# Patient Record
Sex: Female | Born: 1935 | Race: White | Hispanic: No | Marital: Married
Health system: Southern US, Community
[De-identification: ages and names within clinical notes are randomized; demographics above are authoritative.]

---

## 1998-04-30 ENCOUNTER — Ambulatory Visit (HOSPITAL_COMMUNITY): Admission: RE | Admit: 1998-04-30 | Discharge: 1998-04-30 | Payer: Self-pay | Admitting: Neurosurgery

## 1998-08-24 ENCOUNTER — Encounter: Payer: Self-pay | Admitting: Neurosurgery

## 1998-08-24 ENCOUNTER — Ambulatory Visit (HOSPITAL_COMMUNITY): Admission: RE | Admit: 1998-08-24 | Discharge: 1998-08-24 | Payer: Self-pay | Admitting: Neurosurgery

## 1998-09-05 ENCOUNTER — Emergency Department (HOSPITAL_COMMUNITY): Admission: EM | Admit: 1998-09-05 | Discharge: 1998-09-05 | Payer: Self-pay | Admitting: Emergency Medicine

## 1999-12-03 ENCOUNTER — Encounter: Payer: Self-pay | Admitting: Internal Medicine

## 1999-12-03 ENCOUNTER — Encounter: Admission: RE | Admit: 1999-12-03 | Discharge: 1999-12-03 | Payer: Self-pay | Admitting: Internal Medicine

## 2001-11-09 ENCOUNTER — Encounter: Admission: RE | Admit: 2001-11-09 | Discharge: 2001-11-09 | Payer: Self-pay

## 2002-01-30 ENCOUNTER — Encounter: Admission: RE | Admit: 2002-01-30 | Discharge: 2002-01-30 | Payer: Self-pay | Admitting: Cardiology

## 2002-01-30 ENCOUNTER — Encounter: Payer: Self-pay | Admitting: Cardiology

## 2004-06-02 ENCOUNTER — Other Ambulatory Visit: Payer: Self-pay

## 2004-06-03 ENCOUNTER — Other Ambulatory Visit: Payer: Self-pay

## 2004-06-04 ENCOUNTER — Other Ambulatory Visit: Payer: Self-pay

## 2004-07-12 ENCOUNTER — Other Ambulatory Visit: Payer: Self-pay

## 2004-07-13 ENCOUNTER — Other Ambulatory Visit: Payer: Self-pay

## 2004-08-21 ENCOUNTER — Ambulatory Visit: Payer: Self-pay | Admitting: Oncology

## 2004-09-30 ENCOUNTER — Ambulatory Visit: Payer: Self-pay | Admitting: Oncology

## 2004-10-21 ENCOUNTER — Ambulatory Visit: Payer: Self-pay | Admitting: Oncology

## 2004-12-23 ENCOUNTER — Ambulatory Visit: Payer: Self-pay | Admitting: Oncology

## 2005-01-19 ENCOUNTER — Ambulatory Visit: Payer: Self-pay | Admitting: Oncology

## 2005-03-26 ENCOUNTER — Other Ambulatory Visit: Payer: Self-pay

## 2005-03-26 ENCOUNTER — Emergency Department: Payer: Self-pay | Admitting: Emergency Medicine

## 2005-04-19 ENCOUNTER — Ambulatory Visit: Payer: Self-pay | Admitting: Gastroenterology

## 2005-05-04 ENCOUNTER — Ambulatory Visit: Payer: Self-pay | Admitting: Oncology

## 2005-05-20 ENCOUNTER — Emergency Department: Payer: Self-pay | Admitting: Unknown Physician Specialty

## 2005-05-21 ENCOUNTER — Ambulatory Visit: Payer: Self-pay | Admitting: Oncology

## 2005-05-26 ENCOUNTER — Inpatient Hospital Stay: Payer: Self-pay | Admitting: Internal Medicine

## 2005-05-26 ENCOUNTER — Other Ambulatory Visit: Payer: Self-pay

## 2005-06-21 ENCOUNTER — Ambulatory Visit: Payer: Self-pay | Admitting: Oncology

## 2005-07-09 IMAGING — CR DG CHEST 1V PORT
1 series · 1 of 1 positions shown · non-contrast
Comparison: none

REASON FOR EXAM: Chest pain
COMMENTS:

[view not recorded]
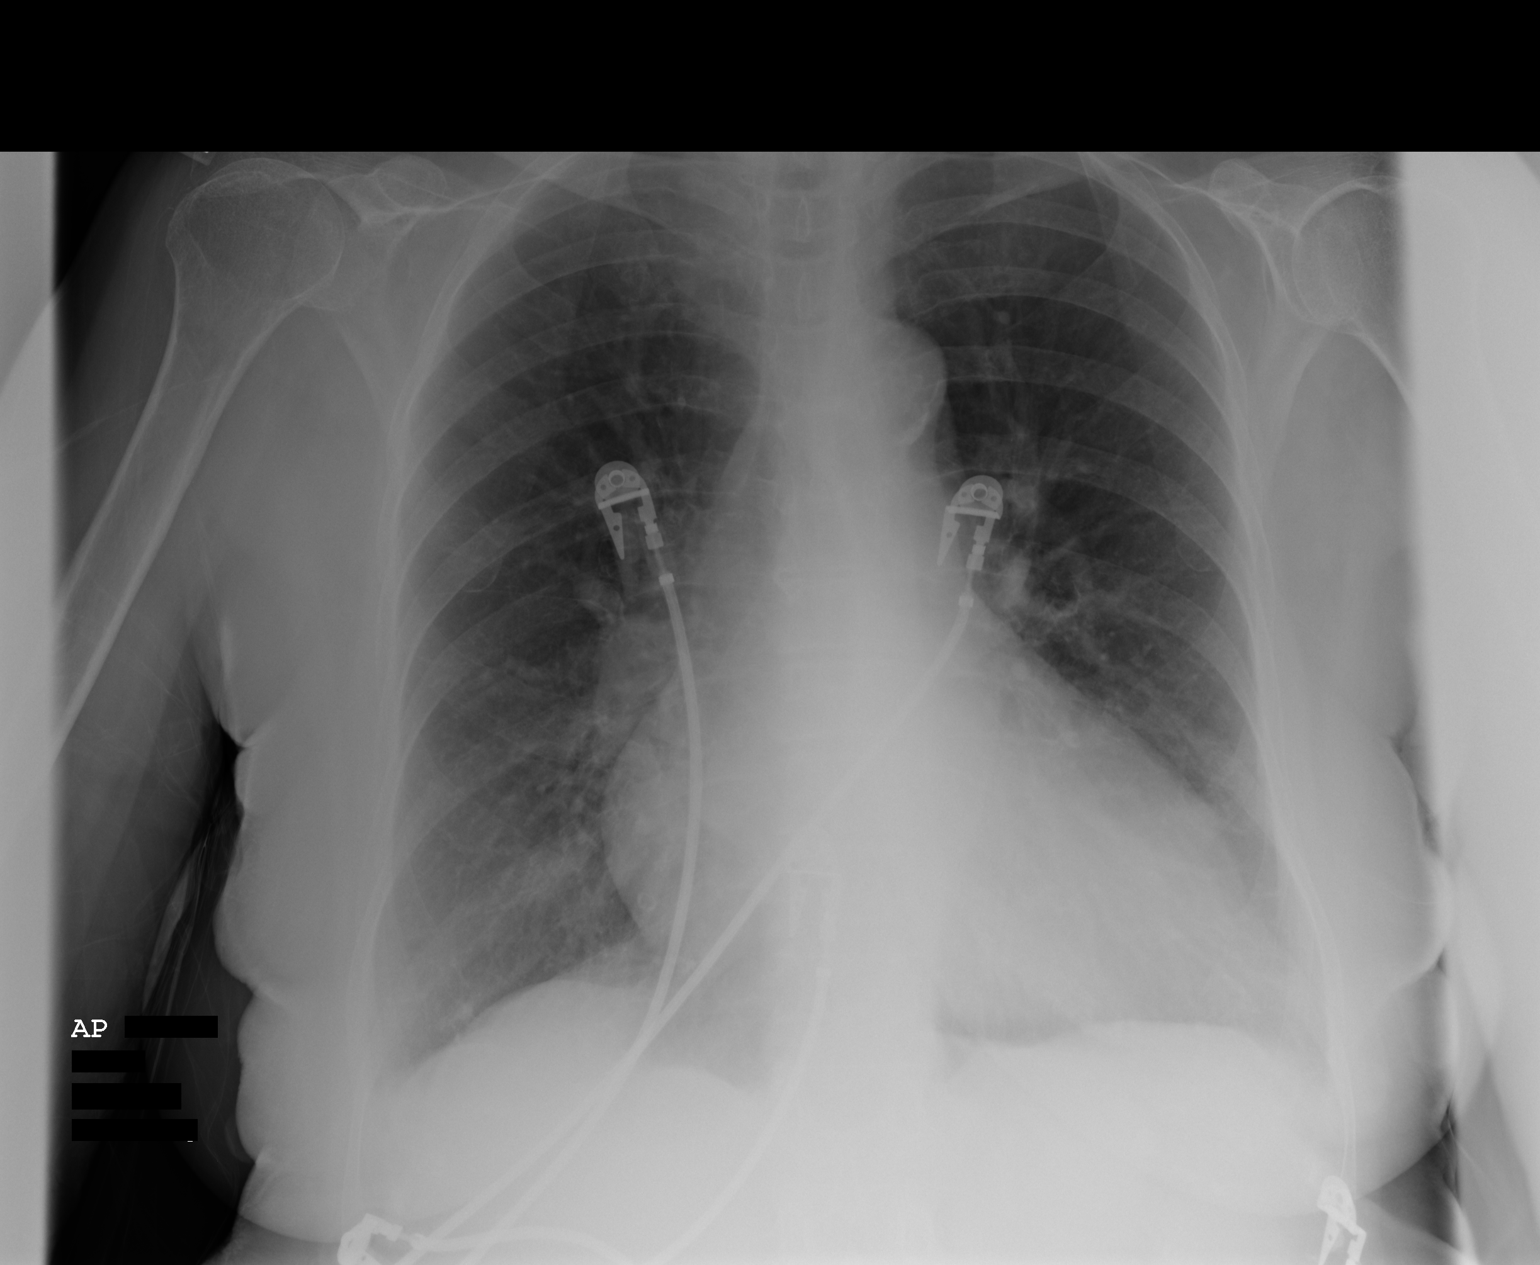

[1 of 1 positions shown; findings below may reference images not displayed]

PROCEDURE:     DXR - DXR PORTABLE CHEST SINGLE VIEW  - March 26, 2005 [DATE]

RESULT:     The study is compared to a previous study dated 07/12/2004.

There is flattening of the hemidiaphragms. No focal regions of consolidation
or focal infiltrates are demonstrated. There is thickening of the
interstitial markings. The cardiac silhouette is enlarged indicative of
cardiomegaly. The visualized bony skeleton demonstrates no evidence of
fracture or dislocation.
IMPRESSION: Findings consistent with COPD with likely an element of
pulmonary fibrosis as well as pulmonary vascular congestion/mild edema. No
focal regions of consolidation are appreciated.

## 2005-07-22 ENCOUNTER — Ambulatory Visit: Payer: Self-pay | Admitting: Oncology

## 2005-08-21 ENCOUNTER — Ambulatory Visit: Payer: Self-pay | Admitting: Oncology

## 2005-09-21 ENCOUNTER — Ambulatory Visit: Payer: Self-pay | Admitting: Oncology

## 2005-10-21 ENCOUNTER — Ambulatory Visit: Payer: Self-pay | Admitting: Oncology

## 2005-11-24 ENCOUNTER — Ambulatory Visit: Payer: Self-pay | Admitting: Oncology

## 2005-12-21 ENCOUNTER — Ambulatory Visit: Payer: Self-pay | Admitting: Unknown Physician Specialty

## 2005-12-22 ENCOUNTER — Ambulatory Visit: Payer: Self-pay | Admitting: Oncology

## 2006-01-19 ENCOUNTER — Ambulatory Visit: Payer: Self-pay | Admitting: Oncology

## 2006-02-23 ENCOUNTER — Ambulatory Visit: Payer: Self-pay | Admitting: Oncology

## 2006-03-21 ENCOUNTER — Ambulatory Visit: Payer: Self-pay | Admitting: Oncology

## 2006-04-21 ENCOUNTER — Ambulatory Visit: Payer: Self-pay | Admitting: Oncology

## 2006-05-21 ENCOUNTER — Ambulatory Visit: Payer: Self-pay | Admitting: Oncology

## 2006-06-21 ENCOUNTER — Ambulatory Visit: Payer: Self-pay | Admitting: Oncology

## 2006-07-28 ENCOUNTER — Ambulatory Visit: Payer: Self-pay | Admitting: Oncology

## 2006-08-21 ENCOUNTER — Ambulatory Visit: Payer: Self-pay | Admitting: Oncology

## 2006-11-15 ENCOUNTER — Ambulatory Visit: Payer: Self-pay | Admitting: Oncology

## 2006-11-21 ENCOUNTER — Ambulatory Visit: Payer: Self-pay | Admitting: Oncology

## 2006-12-19 ENCOUNTER — Emergency Department: Payer: Self-pay | Admitting: Unknown Physician Specialty

## 2006-12-27 ENCOUNTER — Ambulatory Visit: Payer: Self-pay | Admitting: Oncology

## 2007-01-20 ENCOUNTER — Ambulatory Visit: Payer: Self-pay | Admitting: Oncology

## 2007-02-07 ENCOUNTER — Ambulatory Visit: Payer: Self-pay | Admitting: Oncology

## 2007-02-20 ENCOUNTER — Ambulatory Visit: Payer: Self-pay | Admitting: Oncology

## 2007-03-22 ENCOUNTER — Ambulatory Visit: Payer: Self-pay | Admitting: Oncology

## 2007-04-22 ENCOUNTER — Ambulatory Visit: Payer: Self-pay | Admitting: Oncology

## 2007-05-18 ENCOUNTER — Ambulatory Visit: Payer: Self-pay | Admitting: Oncology

## 2007-05-22 ENCOUNTER — Ambulatory Visit: Payer: Self-pay | Admitting: Oncology

## 2007-06-19 ENCOUNTER — Ambulatory Visit: Payer: Self-pay | Admitting: Cardiology

## 2007-06-22 ENCOUNTER — Ambulatory Visit: Payer: Self-pay | Admitting: Oncology

## 2007-07-23 ENCOUNTER — Ambulatory Visit: Payer: Self-pay | Admitting: Oncology

## 2007-08-22 ENCOUNTER — Ambulatory Visit: Payer: Self-pay | Admitting: Oncology

## 2007-09-22 ENCOUNTER — Ambulatory Visit: Payer: Self-pay | Admitting: Oncology

## 2007-10-22 ENCOUNTER — Ambulatory Visit: Payer: Self-pay | Admitting: Oncology

## 2007-12-23 ENCOUNTER — Ambulatory Visit: Payer: Self-pay | Admitting: Oncology

## 2007-12-26 ENCOUNTER — Ambulatory Visit: Payer: Self-pay | Admitting: Internal Medicine

## 2008-01-18 ENCOUNTER — Ambulatory Visit: Payer: Self-pay | Admitting: Oncology

## 2008-01-20 ENCOUNTER — Ambulatory Visit: Payer: Self-pay | Admitting: Oncology

## 2008-02-20 ENCOUNTER — Ambulatory Visit: Payer: Self-pay | Admitting: Oncology

## 2008-03-21 ENCOUNTER — Ambulatory Visit: Payer: Self-pay | Admitting: Oncology

## 2008-04-21 ENCOUNTER — Ambulatory Visit: Payer: Self-pay | Admitting: Oncology

## 2008-05-21 ENCOUNTER — Ambulatory Visit: Payer: Self-pay | Admitting: Oncology

## 2008-06-21 ENCOUNTER — Ambulatory Visit: Payer: Self-pay | Admitting: Oncology

## 2008-07-22 ENCOUNTER — Ambulatory Visit: Payer: Self-pay | Admitting: Oncology

## 2008-08-21 ENCOUNTER — Ambulatory Visit: Payer: Self-pay | Admitting: Oncology

## 2008-09-22 ENCOUNTER — Ambulatory Visit: Payer: Self-pay | Admitting: Oncology

## 2008-10-21 ENCOUNTER — Ambulatory Visit: Payer: Self-pay | Admitting: Oncology

## 2008-11-21 ENCOUNTER — Ambulatory Visit: Payer: Self-pay | Admitting: Oncology

## 2008-12-07 ENCOUNTER — Observation Stay: Payer: Self-pay | Admitting: Internal Medicine

## 2008-12-22 ENCOUNTER — Ambulatory Visit: Payer: Self-pay | Admitting: Oncology

## 2009-01-08 ENCOUNTER — Ambulatory Visit: Payer: Self-pay | Admitting: Oncology

## 2009-01-19 ENCOUNTER — Ambulatory Visit: Payer: Self-pay | Admitting: Oncology

## 2009-02-19 ENCOUNTER — Ambulatory Visit: Payer: Self-pay | Admitting: Oncology

## 2009-03-21 ENCOUNTER — Ambulatory Visit: Payer: Self-pay | Admitting: Oncology

## 2009-04-21 ENCOUNTER — Ambulatory Visit: Payer: Self-pay | Admitting: Oncology

## 2009-05-21 ENCOUNTER — Ambulatory Visit: Payer: Self-pay | Admitting: Oncology

## 2009-06-21 ENCOUNTER — Ambulatory Visit: Payer: Self-pay | Admitting: Oncology

## 2009-07-22 ENCOUNTER — Ambulatory Visit: Payer: Self-pay | Admitting: Oncology

## 2009-08-21 ENCOUNTER — Ambulatory Visit: Payer: Self-pay | Admitting: Oncology

## 2009-09-10 ENCOUNTER — Ambulatory Visit: Payer: Self-pay | Admitting: Oncology

## 2009-09-21 ENCOUNTER — Ambulatory Visit: Payer: Self-pay | Admitting: Oncology

## 2009-10-21 ENCOUNTER — Ambulatory Visit: Payer: Self-pay | Admitting: Oncology

## 2009-10-29 ENCOUNTER — Ambulatory Visit: Payer: Self-pay | Admitting: Oncology

## 2009-10-30 ENCOUNTER — Inpatient Hospital Stay: Payer: Self-pay | Admitting: Internal Medicine

## 2009-11-03 ENCOUNTER — Encounter: Payer: Self-pay | Admitting: Internal Medicine

## 2009-11-05 ENCOUNTER — Ambulatory Visit: Payer: Self-pay | Admitting: Internal Medicine

## 2009-11-21 ENCOUNTER — Ambulatory Visit: Payer: Self-pay | Admitting: Oncology

## 2009-11-21 ENCOUNTER — Encounter: Payer: Self-pay | Admitting: Internal Medicine

## 2009-12-22 ENCOUNTER — Encounter: Payer: Self-pay | Admitting: Internal Medicine

## 2009-12-22 ENCOUNTER — Ambulatory Visit: Payer: Self-pay | Admitting: Oncology

## 2010-01-11 ENCOUNTER — Emergency Department: Payer: Self-pay | Admitting: Emergency Medicine

## 2010-01-14 ENCOUNTER — Ambulatory Visit: Payer: Self-pay | Admitting: Oncology

## 2010-01-19 ENCOUNTER — Ambulatory Visit: Payer: Self-pay | Admitting: Oncology

## 2010-03-21 ENCOUNTER — Ambulatory Visit: Payer: Self-pay | Admitting: Oncology

## 2010-04-15 ENCOUNTER — Ambulatory Visit: Payer: Self-pay | Admitting: Oncology

## 2010-04-21 ENCOUNTER — Ambulatory Visit: Payer: Self-pay | Admitting: Oncology

## 2010-06-21 ENCOUNTER — Ambulatory Visit: Payer: Self-pay | Admitting: Oncology

## 2010-07-16 ENCOUNTER — Ambulatory Visit: Payer: Self-pay | Admitting: Oncology

## 2010-07-22 ENCOUNTER — Ambulatory Visit: Payer: Self-pay | Admitting: Oncology

## 2010-10-21 ENCOUNTER — Ambulatory Visit: Payer: Self-pay | Admitting: Oncology

## 2010-11-21 ENCOUNTER — Ambulatory Visit: Payer: Self-pay | Admitting: Oncology

## 2010-12-11 ENCOUNTER — Encounter: Payer: Self-pay | Admitting: Internal Medicine

## 2011-02-21 ENCOUNTER — Ambulatory Visit: Payer: Self-pay | Admitting: Oncology

## 2011-03-22 ENCOUNTER — Ambulatory Visit: Payer: Self-pay | Admitting: Oncology

## 2011-06-13 ENCOUNTER — Ambulatory Visit: Payer: Self-pay | Admitting: Oncology

## 2011-06-22 ENCOUNTER — Ambulatory Visit: Payer: Self-pay | Admitting: Oncology

## 2011-09-26 ENCOUNTER — Ambulatory Visit: Payer: Self-pay | Admitting: Oncology

## 2011-10-22 ENCOUNTER — Ambulatory Visit: Payer: Self-pay | Admitting: Oncology

## 2011-11-22 ENCOUNTER — Ambulatory Visit: Payer: Self-pay | Admitting: Internal Medicine

## 2011-12-10 LAB — COMPREHENSIVE METABOLIC PANEL
Albumin: 3.5 g/dL (ref 3.4–5.0)
Alkaline Phosphatase: 84 U/L (ref 50–136)
Anion Gap: 14 (ref 7–16)
BUN: 55 mg/dL — ABNORMAL HIGH (ref 7–18)
Bilirubin,Total: 0.4 mg/dL (ref 0.2–1.0)
Calcium, Total: 15.1 mg/dL (ref 8.5–10.1)
Chloride: 110 mmol/L — ABNORMAL HIGH (ref 98–107)
Co2: 25 mmol/L (ref 21–32)
Creatinine: 2.67 mg/dL — ABNORMAL HIGH (ref 0.60–1.30)
EGFR (African American): 22 — ABNORMAL LOW
EGFR (Non-African Amer.): 18 — ABNORMAL LOW
Glucose: 115 mg/dL — ABNORMAL HIGH (ref 65–99)
Osmolality: 312 (ref 275–301)
Potassium: 3.4 mmol/L — ABNORMAL LOW (ref 3.5–5.1)
SGOT(AST): 49 U/L — ABNORMAL HIGH (ref 15–37)
SGPT (ALT): 32 U/L
Sodium: 149 mmol/L — ABNORMAL HIGH (ref 136–145)
Total Protein: 7.8 g/dL (ref 6.4–8.2)

## 2011-12-10 LAB — CBC
HCT: 40.3 % (ref 35.0–47.0)
HGB: 13.3 g/dL (ref 12.0–16.0)
MCH: 28.9 pg (ref 26.0–34.0)
MCHC: 33.1 g/dL (ref 32.0–36.0)
MCV: 88 fL (ref 80–100)
Platelet: 676 10*3/uL — ABNORMAL HIGH (ref 150–440)
RBC: 4.61 10*6/uL (ref 3.80–5.20)
RDW: 18 % — ABNORMAL HIGH (ref 11.5–14.5)
WBC: 35.8 10*3/uL — ABNORMAL HIGH (ref 3.6–11.0)

## 2011-12-10 LAB — URINALYSIS, COMPLETE
Bilirubin,UR: NEGATIVE
Blood: NEGATIVE
Glucose,UR: NEGATIVE mg/dL (ref 0–75)
Hyaline Cast: 3
Ketone: NEGATIVE
Nitrite: NEGATIVE
Ph: 6 (ref 4.5–8.0)
Protein: 30
RBC,UR: 10 /HPF (ref 0–5)
Specific Gravity: 1.01 (ref 1.003–1.030)
Squamous Epithelial: NONE SEEN
WBC UR: 70 /HPF (ref 0–5)

## 2011-12-10 LAB — CK TOTAL AND CKMB (NOT AT ARMC)
CK, Total: 388 U/L — ABNORMAL HIGH (ref 21–215)
CK-MB: 23.9 ng/mL — ABNORMAL HIGH (ref 0.5–3.6)

## 2011-12-11 ENCOUNTER — Inpatient Hospital Stay: Payer: Self-pay | Admitting: Internal Medicine

## 2011-12-11 LAB — TROPONIN I
Troponin-I: 1.2 ng/mL — ABNORMAL HIGH
Troponin-I: 1.04 ng/mL — ABNORMAL HIGH
Troponin-I: 1.21 ng/mL — ABNORMAL HIGH

## 2011-12-11 LAB — CK TOTAL AND CKMB (NOT AT ARMC)
CK, Total: 257 U/L — ABNORMAL HIGH (ref 21–215)
CK, Total: 101 U/L (ref 21–215)
CK-MB: 7 ng/mL — ABNORMAL HIGH (ref 0.5–3.6)

## 2011-12-11 LAB — TSH: Thyroid Stimulating Horm: 1.29 u[IU]/mL

## 2011-12-12 LAB — CBC WITH DIFFERENTIAL/PLATELET
Bands: 3 %
Basophil #: 0 10*3/uL (ref 0.0–0.1)
Eosinophil %: 0 %
HGB: 10.7 g/dL — ABNORMAL LOW (ref 12.0–16.0)
Lymphocyte #: 1 10*3/uL (ref 1.0–3.6)
Lymphocyte %: 3.2 %
Lymphocytes: 4 %
MCV: 88 fL (ref 80–100)
Monocyte %: 7.2 %
Monocytes: 7 %
Neutrophil #: 27.4 10*3/uL — ABNORMAL HIGH (ref 1.4–6.5)
Neutrophil %: 89.6 %
RDW: 18.2 % — ABNORMAL HIGH (ref 11.5–14.5)
Segmented Neutrophils: 84 %
Variant Lymphocyte - H1-Rlymph: 2 %

## 2011-12-12 LAB — COMPREHENSIVE METABOLIC PANEL
Albumin: 2.5 g/dL — ABNORMAL LOW (ref 3.4–5.0)
Alkaline Phosphatase: 90 U/L (ref 50–136)
Bilirubin,Total: 0.5 mg/dL (ref 0.2–1.0)
Calcium, Total: 14.4 mg/dL (ref 8.5–10.1)
Co2: 24 mmol/L (ref 21–32)
EGFR (African American): 18 — ABNORMAL LOW
Glucose: 109 mg/dL — ABNORMAL HIGH (ref 65–99)
SGOT(AST): 63 U/L — ABNORMAL HIGH (ref 15–37)
SGPT (ALT): 56 U/L

## 2011-12-14 LAB — CBC WITH DIFFERENTIAL/PLATELET
Basophil #: 0 10*3/uL (ref 0.0–0.1)
Basophil %: 0 %
Eosinophil %: 0.6 %
HCT: 31.9 % — ABNORMAL LOW (ref 35.0–47.0)
HGB: 10.4 g/dL — ABNORMAL LOW (ref 12.0–16.0)
Lymphocyte %: 3.3 %
MCH: 28.7 pg (ref 26.0–34.0)
MCV: 88 fL (ref 80–100)
Monocyte #: 1.6 10*3/uL — ABNORMAL HIGH (ref 0.0–0.7)
Monocyte %: 8.2 %
Neutrophil #: 17.6 10*3/uL — ABNORMAL HIGH (ref 1.4–6.5)
Platelet: 404 10*3/uL (ref 150–440)
RBC: 3.63 10*6/uL — ABNORMAL LOW (ref 3.80–5.20)
RDW: 18.2 % — ABNORMAL HIGH (ref 11.5–14.5)
WBC: 20.1 10*3/uL — ABNORMAL HIGH (ref 3.6–11.0)

## 2011-12-14 LAB — BASIC METABOLIC PANEL
Anion Gap: 8 (ref 7–16)
Calcium, Total: 14.1 mg/dL (ref 8.5–10.1)
Chloride: 122 mmol/L — ABNORMAL HIGH (ref 98–107)
Co2: 23 mmol/L (ref 21–32)
Creatinine: 2.3 mg/dL — ABNORMAL HIGH (ref 0.60–1.30)
Glucose: 89 mg/dL (ref 65–99)
Potassium: 4.8 mmol/L (ref 3.5–5.1)
Sodium: 153 mmol/L — ABNORMAL HIGH (ref 136–145)

## 2011-12-14 LAB — URINE CULTURE

## 2011-12-15 LAB — DIGOXIN LEVEL: Digoxin: 2.17 ng/mL

## 2011-12-15 LAB — BASIC METABOLIC PANEL
Anion Gap: 10 (ref 7–16)
BUN: 57 mg/dL — ABNORMAL HIGH (ref 7–18)
Chloride: 125 mmol/L — ABNORMAL HIGH (ref 98–107)
Creatinine: 1.82 mg/dL — ABNORMAL HIGH (ref 0.60–1.30)
EGFR (African American): 34 — ABNORMAL LOW
EGFR (Non-African Amer.): 28 — ABNORMAL LOW
Glucose: 80 mg/dL (ref 65–99)
Osmolality: 328 (ref 275–301)

## 2011-12-15 LAB — CBC WITH DIFFERENTIAL/PLATELET
Basophil #: 0 10*3/uL (ref 0.0–0.1)
Eosinophil #: 0.2 10*3/uL (ref 0.0–0.7)
Eosinophil %: 1 %
MCH: 28.5 pg (ref 26.0–34.0)
MCHC: 32.5 g/dL (ref 32.0–36.0)
Monocyte #: 1.6 10*3/uL — ABNORMAL HIGH (ref 0.0–0.7)
Monocyte %: 7.9 %
Neutrophil %: 87.5 %
Platelet: 385 10*3/uL (ref 150–440)

## 2011-12-16 LAB — CBC WITH DIFFERENTIAL/PLATELET
Basophil %: 0.1 %
Eosinophil #: 0.4 10*3/uL (ref 0.0–0.7)
Eosinophil %: 1.9 %
HCT: 31.8 % — ABNORMAL LOW (ref 35.0–47.0)
HGB: 10.5 g/dL — ABNORMAL LOW (ref 12.0–16.0)
Lymphocyte #: 0.7 10*3/uL — ABNORMAL LOW (ref 1.0–3.6)
Lymphocyte %: 3.7 %
MCHC: 33 g/dL (ref 32.0–36.0)
Monocyte #: 1.6 10*3/uL — ABNORMAL HIGH (ref 0.0–0.7)
Monocyte %: 8.2 %
Neutrophil #: 16.5 10*3/uL — ABNORMAL HIGH (ref 1.4–6.5)
Neutrophil %: 86.1 %
RBC: 3.64 10*6/uL — ABNORMAL LOW (ref 3.80–5.20)
RDW: 18.2 % — ABNORMAL HIGH (ref 11.5–14.5)

## 2011-12-16 LAB — BASIC METABOLIC PANEL
Chloride: 126 mmol/L — ABNORMAL HIGH (ref 98–107)
EGFR (African American): 35 — ABNORMAL LOW
EGFR (Non-African Amer.): 29 — ABNORMAL LOW
Glucose: 137 mg/dL — ABNORMAL HIGH (ref 65–99)
Sodium: 158 mmol/L — ABNORMAL HIGH (ref 136–145)

## 2011-12-16 LAB — CULTURE, BLOOD (SINGLE)

## 2011-12-17 LAB — CBC WITH DIFFERENTIAL/PLATELET
Basophil #: 0 10*3/uL (ref 0.0–0.1)
HCT: 29.7 % — ABNORMAL LOW (ref 35.0–47.0)
HGB: 9.9 g/dL — ABNORMAL LOW (ref 12.0–16.0)
Lymphocyte #: 1 10*3/uL (ref 1.0–3.6)
Lymphocyte %: 4.7 %
MCHC: 33.4 g/dL (ref 32.0–36.0)
MCV: 86 fL (ref 80–100)
Monocyte %: 8.8 %
Neutrophil %: 84.3 %
Platelet: 284 10*3/uL (ref 150–440)
RDW: 18.1 % — ABNORMAL HIGH (ref 11.5–14.5)

## 2011-12-17 LAB — BASIC METABOLIC PANEL
Anion Gap: 7 (ref 7–16)
BUN: 42 mg/dL — ABNORMAL HIGH (ref 7–18)
Calcium, Total: 11.1 mg/dL — ABNORMAL HIGH (ref 8.5–10.1)
Co2: 25 mmol/L (ref 21–32)
Creatinine: 1.59 mg/dL — ABNORMAL HIGH (ref 0.60–1.30)
EGFR (African American): 39 — ABNORMAL LOW
Sodium: 155 mmol/L — ABNORMAL HIGH (ref 136–145)

## 2011-12-18 LAB — CBC WITH DIFFERENTIAL/PLATELET
Basophil #: 0 10*3/uL (ref 0.0–0.1)
Basophil %: 0.1 %
Eosinophil #: 0.5 10*3/uL (ref 0.0–0.7)
HCT: 28.7 % — ABNORMAL LOW (ref 35.0–47.0)
HGB: 9.4 g/dL — ABNORMAL LOW (ref 12.0–16.0)
Lymphocyte #: 0.9 10*3/uL — ABNORMAL LOW (ref 1.0–3.6)
Lymphocyte %: 5.4 %
MCH: 28.3 pg (ref 26.0–34.0)
MCHC: 32.6 g/dL (ref 32.0–36.0)
MCV: 87 fL (ref 80–100)
Monocyte #: 1.2 10*3/uL — ABNORMAL HIGH (ref 0.0–0.7)
Monocyte %: 6.7 %
Neutrophil #: 15 10*3/uL — ABNORMAL HIGH (ref 1.4–6.5)
Platelet: 249 10*3/uL (ref 150–440)
RBC: 3.31 10*6/uL — ABNORMAL LOW (ref 3.80–5.20)
RDW: 18 % — ABNORMAL HIGH (ref 11.5–14.5)
WBC: 17.6 10*3/uL — ABNORMAL HIGH (ref 3.6–11.0)

## 2011-12-18 LAB — BASIC METABOLIC PANEL
BUN: 40 mg/dL — ABNORMAL HIGH (ref 7–18)
Chloride: 121 mmol/L — ABNORMAL HIGH (ref 98–107)
Creatinine: 1.54 mg/dL — ABNORMAL HIGH (ref 0.60–1.30)
Potassium: 3.8 mmol/L (ref 3.5–5.1)
Sodium: 155 mmol/L — ABNORMAL HIGH (ref 136–145)

## 2011-12-19 LAB — CBC WITH DIFFERENTIAL/PLATELET
Basophil %: 0.2 %
Eosinophil #: 0.5 10*3/uL (ref 0.0–0.7)
Eosinophil %: 2.6 %
HCT: 25.8 % — ABNORMAL LOW (ref 35.0–47.0)
HGB: 8.5 g/dL — ABNORMAL LOW (ref 12.0–16.0)
Lymphocyte #: 0.9 10*3/uL — ABNORMAL LOW (ref 1.0–3.6)
Lymphocyte %: 4.4 %
MCHC: 33 g/dL (ref 32.0–36.0)
MCV: 87 fL (ref 80–100)
Monocyte #: 0.8 10*3/uL — ABNORMAL HIGH (ref 0.0–0.7)
Neutrophil #: 18 10*3/uL — ABNORMAL HIGH (ref 1.4–6.5)
Neutrophil %: 88.7 %
RBC: 2.98 10*6/uL — ABNORMAL LOW (ref 3.80–5.20)

## 2011-12-19 LAB — BASIC METABOLIC PANEL
BUN: 35 mg/dL — ABNORMAL HIGH (ref 7–18)
Calcium, Total: 9.1 mg/dL (ref 8.5–10.1)
Co2: 21 mmol/L (ref 21–32)
EGFR (African American): 46 — ABNORMAL LOW
Glucose: 97 mg/dL (ref 65–99)
Potassium: 3.8 mmol/L (ref 3.5–5.1)
Sodium: 152 mmol/L — ABNORMAL HIGH (ref 136–145)

## 2011-12-20 LAB — CBC WITH DIFFERENTIAL/PLATELET
Basophil #: 0 10*3/uL (ref 0.0–0.1)
Basophil %: 0.2 %
Eosinophil #: 0.6 10*3/uL (ref 0.0–0.7)
HCT: 24.5 % — ABNORMAL LOW (ref 35.0–47.0)
Lymphocyte #: 1 10*3/uL (ref 1.0–3.6)
Lymphocyte %: 5 %
MCH: 28.9 pg (ref 26.0–34.0)
MCHC: 33.6 g/dL (ref 32.0–36.0)
Monocyte #: 1.1 10*3/uL — ABNORMAL HIGH (ref 0.0–0.7)
Neutrophil %: 86.2 %
Platelet: 202 10*3/uL (ref 150–440)

## 2011-12-20 LAB — URINALYSIS, COMPLETE
Bacteria: NONE SEEN
Bilirubin,UR: NEGATIVE
Glucose,UR: NEGATIVE mg/dL (ref 0–75)
Hyaline Cast: 1
Leukocyte Esterase: NEGATIVE
Nitrite: NEGATIVE
Ph: 5 (ref 4.5–8.0)
Protein: NEGATIVE
Specific Gravity: 1.014 (ref 1.003–1.030)
Transitional Epi: <1

## 2011-12-20 LAB — BASIC METABOLIC PANEL
BUN: 33 mg/dL — ABNORMAL HIGH (ref 7–18)
Calcium, Total: 8.9 mg/dL (ref 8.5–10.1)
Chloride: 116 mmol/L — ABNORMAL HIGH (ref 98–107)
Creatinine: 1.37 mg/dL — ABNORMAL HIGH (ref 0.60–1.30)
EGFR (African American): 47 — ABNORMAL LOW
EGFR (Non-African Amer.): 38 — ABNORMAL LOW
Glucose: 104 mg/dL — ABNORMAL HIGH (ref 65–99)
Osmolality: 296 (ref 275–301)
Sodium: 145 mmol/L (ref 136–145)

## 2011-12-21 LAB — BASIC METABOLIC PANEL
Anion Gap: 11 (ref 7–16)
Calcium, Total: 8.4 mg/dL — ABNORMAL LOW (ref 8.5–10.1)
Chloride: 112 mmol/L — ABNORMAL HIGH (ref 98–107)
Co2: 20 mmol/L — ABNORMAL LOW (ref 21–32)
Creatinine: 1.25 mg/dL (ref 0.60–1.30)
EGFR (African American): 52 — ABNORMAL LOW
Sodium: 143 mmol/L (ref 136–145)

## 2011-12-22 LAB — CBC WITH DIFFERENTIAL/PLATELET
Basophil #: 0 10*3/uL (ref 0.0–0.1)
Basophil %: 0.1 %
Eosinophil #: 0.5 10*3/uL (ref 0.0–0.7)
Eosinophil %: 3 %
HCT: 23.2 % — ABNORMAL LOW (ref 35.0–47.0)
HGB: 7.7 g/dL — ABNORMAL LOW (ref 12.0–16.0)
Lymphocyte #: 0.9 10*3/uL — ABNORMAL LOW (ref 1.0–3.6)
Lymphocyte %: 5.4 %
MCH: 28.5 pg (ref 26.0–34.0)
MCHC: 33.2 g/dL (ref 32.0–36.0)
Monocyte #: 1.1 10*3/uL — ABNORMAL HIGH (ref 0.0–0.7)
Neutrophil #: 13.9 10*3/uL — ABNORMAL HIGH (ref 1.4–6.5)
Neutrophil %: 84.9 %
RBC: 2.71 10*6/uL — ABNORMAL LOW (ref 3.80–5.20)
WBC: 16.4 10*3/uL — ABNORMAL HIGH (ref 3.6–11.0)

## 2011-12-22 LAB — BASIC METABOLIC PANEL
Anion Gap: 10 (ref 7–16)
BUN: 21 mg/dL — ABNORMAL HIGH (ref 7–18)
Chloride: 111 mmol/L — ABNORMAL HIGH (ref 98–107)
Creatinine: 1.15 mg/dL (ref 0.60–1.30)
EGFR (African American): 57 — ABNORMAL LOW
Glucose: 85 mg/dL (ref 65–99)
Osmolality: 285 (ref 275–301)

## 2011-12-22 LAB — URINE CULTURE

## 2011-12-23 ENCOUNTER — Emergency Department: Payer: Self-pay | Admitting: Emergency Medicine

## 2011-12-23 ENCOUNTER — Ambulatory Visit: Payer: Self-pay | Admitting: Internal Medicine

## 2011-12-23 LAB — CBC WITH DIFFERENTIAL/PLATELET
Basophil #: 0.1 10*3/uL (ref 0.0–0.1)
Basophil #: 0 10*3/uL (ref 0.0–0.1)
Basophil %: 0.5 %
Eosinophil #: 0.5 10*3/uL (ref 0.0–0.7)
Eosinophil %: 3.1 %
HCT: 27 % — ABNORMAL LOW (ref 35.0–47.0)
HCT: 28.3 % — ABNORMAL LOW (ref 35.0–47.0)
HGB: 9 g/dL — ABNORMAL LOW (ref 12.0–16.0)
HGB: 10.1 g/dL — ABNORMAL LOW (ref 12.0–16.0)
Lymphocyte #: 0.8 10*3/uL — ABNORMAL LOW (ref 1.0–3.6)
Lymphocyte #: 0.8 10*3/uL — ABNORMAL LOW (ref 1.0–3.6)
Lymphocyte %: 5.2 %
Lymphocyte %: 5 %
MCH: 30.7 pg (ref 26.0–34.0)
MCHC: 33.4 g/dL (ref 32.0–36.0)
MCV: 87 fL (ref 80–100)
MCV: 86 fL (ref 80–100)
Monocyte #: 1 10*3/uL — ABNORMAL HIGH (ref 0.0–0.7)
Monocyte %: 6.7 %
Monocyte %: 6.6 %
Neutrophil #: 12.1 10*3/uL — ABNORMAL HIGH (ref 1.4–6.5)
Neutrophil #: 13.6 10*3/uL — ABNORMAL HIGH (ref 1.4–6.5)
RDW: 16.8 % — ABNORMAL HIGH (ref 11.5–14.5)
RDW: 16.1 % — ABNORMAL HIGH (ref 11.5–14.5)
WBC: 14.5 10*3/uL — ABNORMAL HIGH (ref 3.6–11.0)

## 2011-12-23 LAB — COMPREHENSIVE METABOLIC PANEL
Albumin: 2.3 g/dL — ABNORMAL LOW (ref 3.4–5.0)
Anion Gap: 9 (ref 7–16)
BUN: 21 mg/dL — ABNORMAL HIGH (ref 7–18)
Bilirubin,Total: 0.4 mg/dL (ref 0.2–1.0)
Co2: 21 mmol/L (ref 21–32)
Creatinine: 1.22 mg/dL (ref 0.60–1.30)
EGFR (Non-African Amer.): 44 — ABNORMAL LOW
Glucose: 108 mg/dL — ABNORMAL HIGH (ref 65–99)
Potassium: 4.1 mmol/L (ref 3.5–5.1)
SGOT(AST): 24 U/L (ref 15–37)
SGPT (ALT): 32 U/L
Sodium: 141 mmol/L (ref 136–145)
Total Protein: 5.8 g/dL — ABNORMAL LOW (ref 6.4–8.2)

## 2011-12-23 LAB — BASIC METABOLIC PANEL
BUN: 21 mg/dL — ABNORMAL HIGH (ref 7–18)
Chloride: 111 mmol/L — ABNORMAL HIGH (ref 98–107)
Co2: 22 mmol/L (ref 21–32)
Creatinine: 1.16 mg/dL (ref 0.60–1.30)
EGFR (African American): 57 — ABNORMAL LOW
EGFR (Non-African Amer.): 47 — ABNORMAL LOW
Glucose: 86 mg/dL (ref 65–99)
Potassium: 4.1 mmol/L (ref 3.5–5.1)
Sodium: 142 mmol/L (ref 136–145)

## 2011-12-23 LAB — TROPONIN I: Troponin-I: 0.04 ng/mL

## 2012-02-20 DEATH — deceased

## 2012-03-24 IMAGING — CR DG CHEST 1V PORT
1 series · 1 of 1 positions shown · non-contrast
Comparison: none

REASON FOR EXAM: ams
COMMENTS:

[t chest supine]
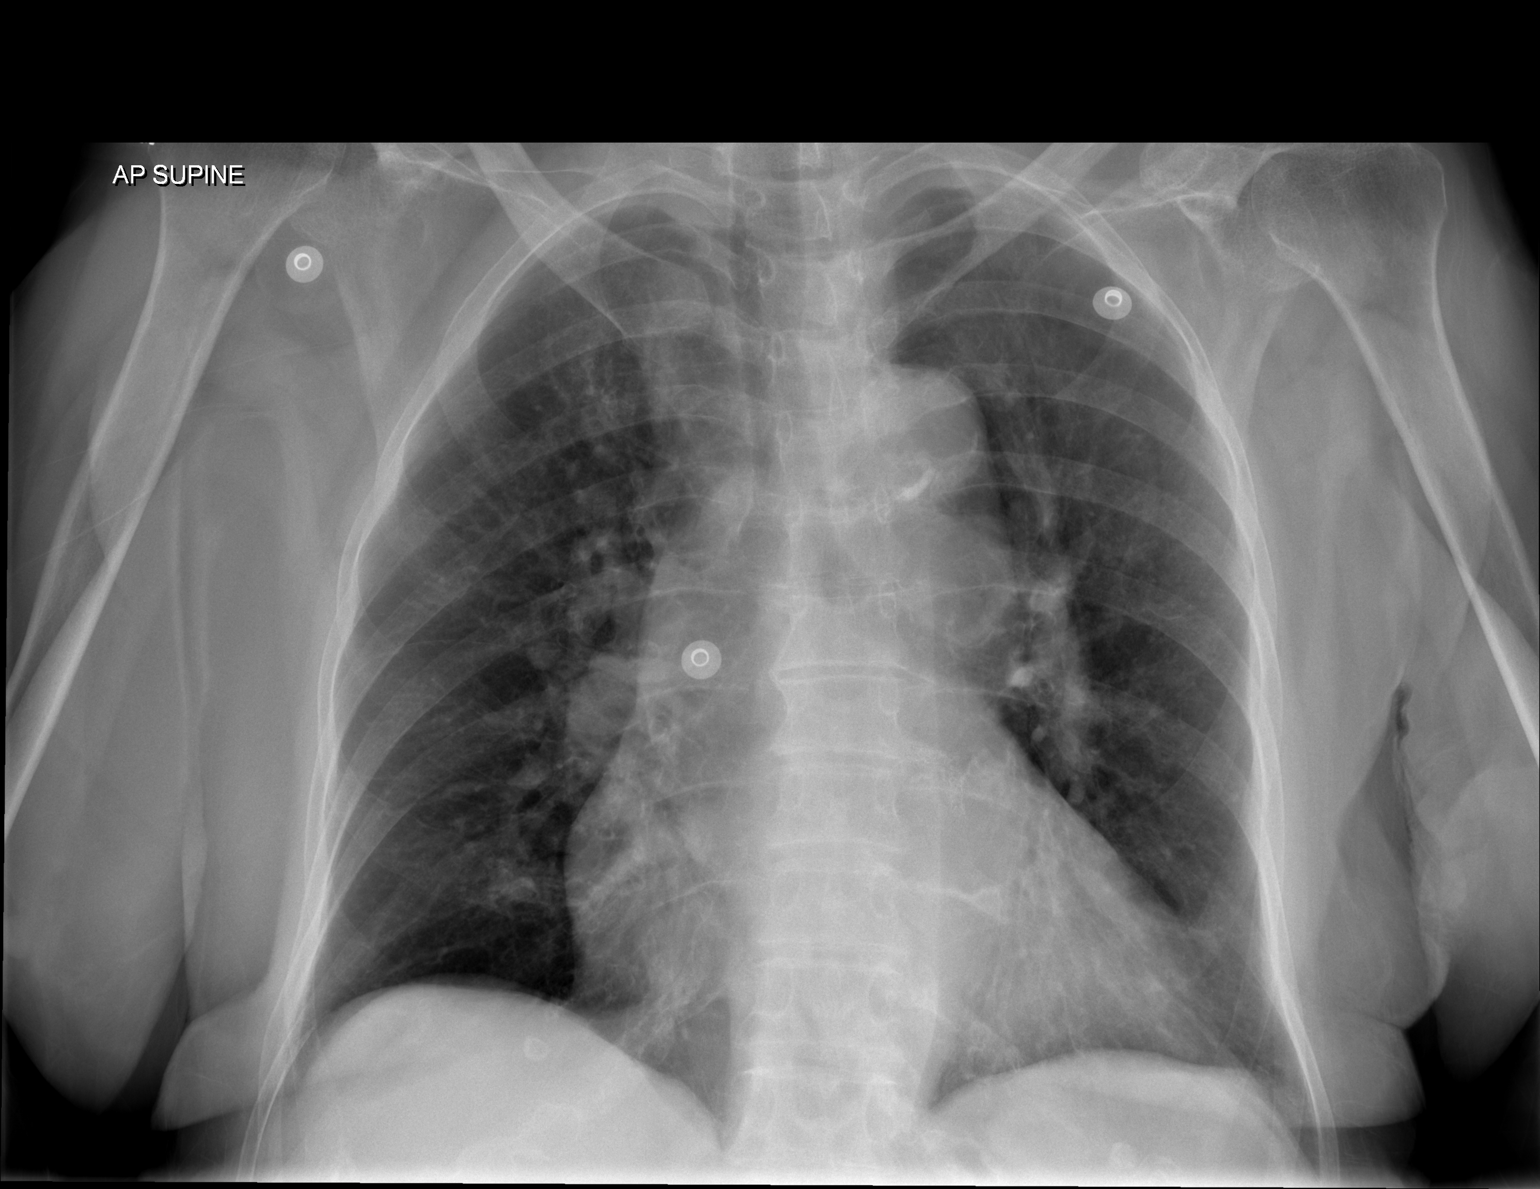

[1 of 1 positions shown; findings below may reference images not displayed]

PROCEDURE:     DXR - DXR PORTABLE CHEST SINGLE VIEW  - December 10, 2011 [DATE]

RESULT:

Frontal view of the chest is performed. Comparison is made to a previous
study dated 11/01/2009.

There are no focal regions of consolidation or focal infiltrates. Mild
prominence of the interstitial markings is identified. The cardiac
silhouette is moderately enlarged. The aorta is tortuous and ectatic.
IMPRESSION: Mild prominence of the interstitial markings. No focal
regions of consolidation or focal infiltrates. These findings may represent
an underlying component of pulmonary vascular congestion.

## 2012-03-26 IMAGING — CR DG CHEST 1V PORT
1 series · 1 of 1 positions shown · non-contrast
Comparison: none

REASON FOR EXAM: aspiration
COMMENTS:

PROCEDURE:     DXR - DXR PORTABLE CHEST SINGLE VIEW  - December 12, 2011  [DATE]
RESULT:
The lungs are clear. The cardiovascular structures are unremarkable. Chest
is stable from 12/10/2011.

[portable]
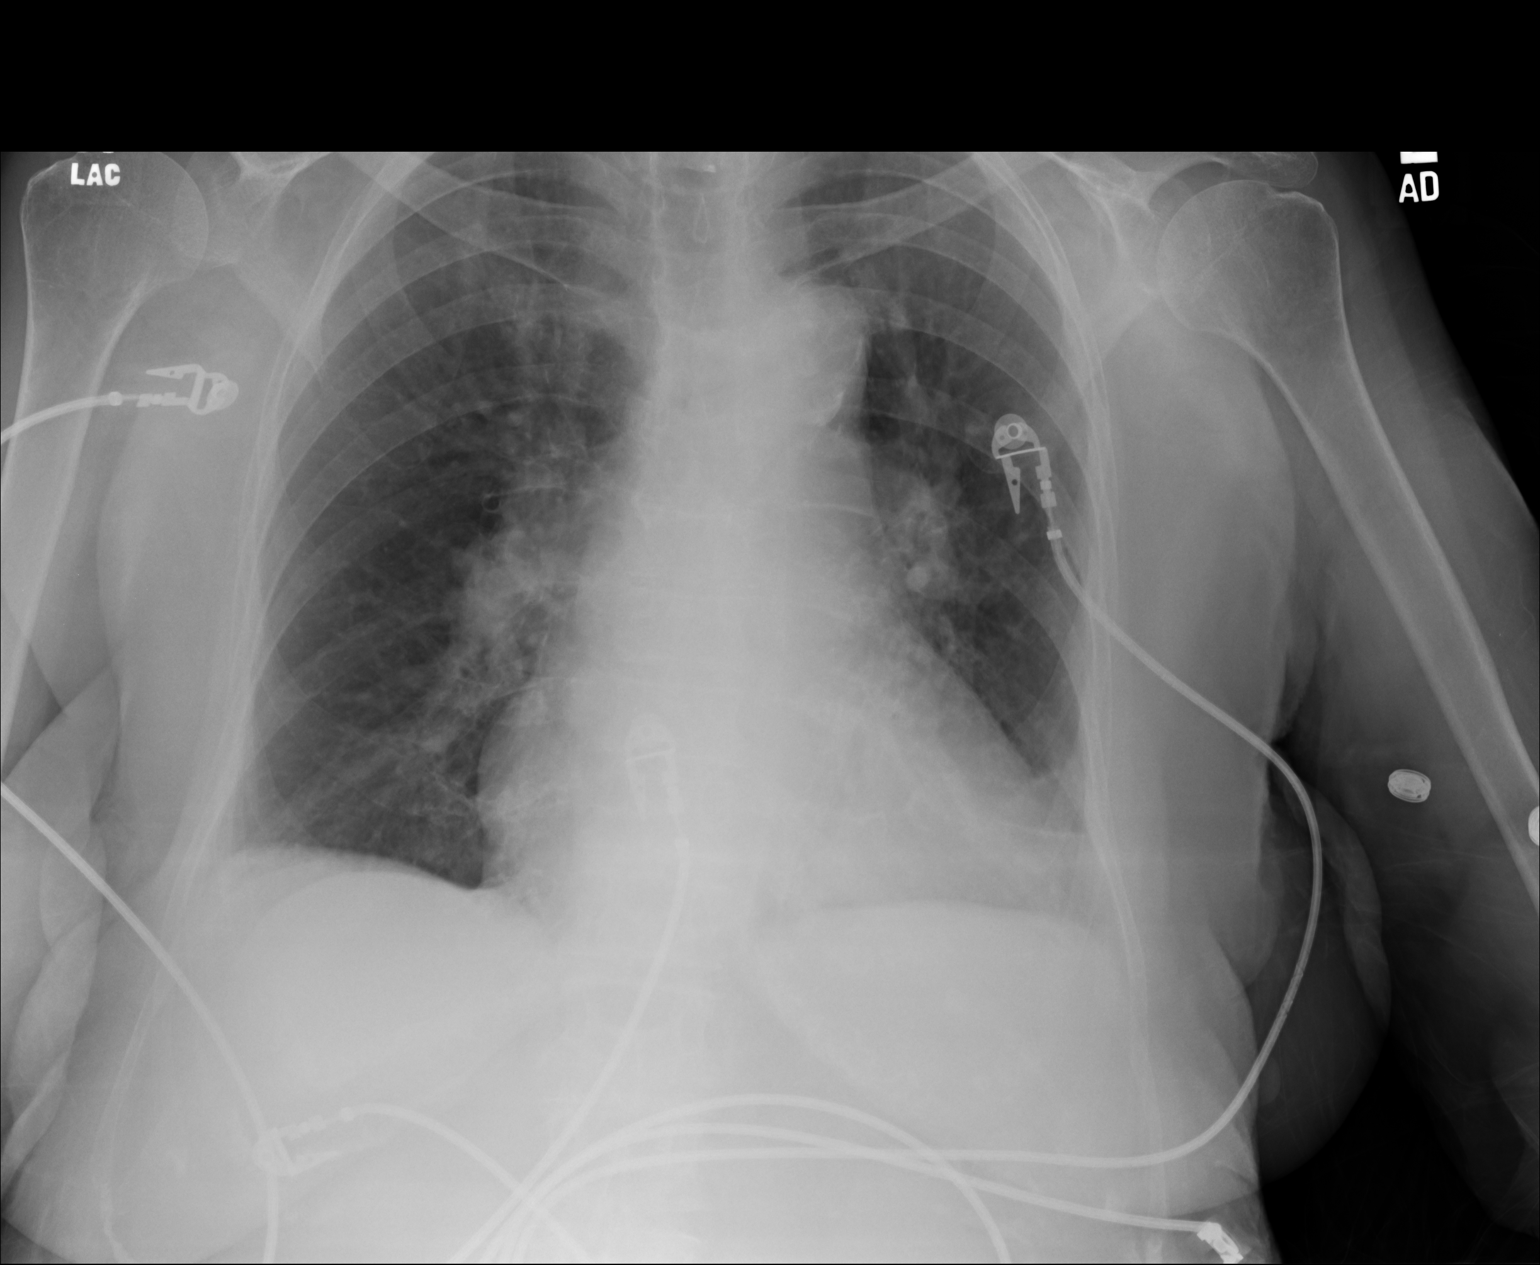

[1 of 1 positions shown; findings below may reference images not displayed]

IMPRESSION: No acute abnormality.

## 2015-03-15 NOTE — H&P (Signed)
PATIENT NAME:  Chelsea Hernandez, Chelsea Hernandez MR#:  [id number] DATE OF BIRTH:  05-08-1919  DATE OF ADMISSION:  12/11/2011  REFERRING PHYSICIAN: Dr. Dolores FrameSung.   FAMILY PHYSICIAN: Dr. Einar CrowMarshall Anderson.   REASON FOR ADMISSION: Acute myocardial infarction with acute renal failure, altered mental status, and hypothermia.   HISTORY OF PRESENT ILLNESS: The patient is a 81 year old female with a history of previous coronary artery disease, status post myocardial infarction and cardiac arrest with percutaneous transluminal coronary angioplasty and stent placement. She has known ischemic cardiomyopathy, as well as dementia and previous stroke. The patient was found by police after they were called by neighbors who had not heard from the patient in several days. The police went to the patient's home where she was found unresponsive on the floor. Her husband was found deceased in the next room and apparently had been deceased for at least 2 to 3 days. The patient was brought to the Emergency Room where she was found to be unresponsive and hypothermic. Labs revealed an acute myocardial infarction with acute renal failure. She was also noted to have a urinary tract infection. She remains unresponsive and is now admitted for further evaluation.   PAST MEDICAL HISTORY:  1. Atherosclerotic cardiovascular disease status post myocardial infarction.  2. Status post percutaneous transluminal coronary angioplasty with stent placement.  3. Ischemic cardiomyopathy.  4. Hypothyroidism.  5. Previous stroke.  6. Senile dementia.  7. History of Bell's palsy.  8. Benign hypertension.  9. Myelodysplasia with chronic anemia.  10. Primary hyperparathyroidism with hypercalcemia.  11. Osteoporosis.  12. Stage III chronic kidney disease.   MEDICATIONS:  1. Agrylin 1 capsule p.o. t.i.d.  2. Aspirin 81 mg p.o. daily.  3. Ativan 1 mg p.o. b.i.d. p.r.n. anxiety.  4. K-Dur 10 milliequivalents p.o. b.i.d.  5. Multivitamin 1 p.o. daily.   6. Norvasc 5 mg p.o. daily.  7. Toprol-XL 100 mg p.o. daily.  8. Zyrtec 10 mg p.o. daily.  9. Amiodarone 100 mg p.o. daily.  10. Prolia 60 mg subcutaneous as directed.  11. Lasix 40 mg p.o. daily.  12. Synthroid 0.1 mg p.o. daily.   ALLERGIES: Cipro, Boniva, cortisone, Excedrin, E-Mycin, contrast dye, iodine, penicillin, sulfa, pertrazine, and tetracycline.   SOCIAL HISTORY: The patient is now widowed. No apparent history of alcohol or tobacco abuse.   FAMILY HISTORY: Positive for coronary artery disease, hypertension, transient ischemic attacks, and asthma.   REVIEW OF SYSTEMS: Unable to obtain from the patient.   PHYSICAL EXAMINATION:  GENERAL: The patient is critically ill-appearing, unresponsive/nonverbal.   VITAL SIGNS: Currently remarkable for a blood pressure of 158/69 with a heart rate of 84 and a respiratory rate of 16. Initial temperature was 95.8.   HEENT: Normocephalic, atraumatic. Pupils are minimally reactive, but equal. Oropharynx is dry but clear.   NECK: Supple without jugular venous distention. No adenopathy is noted.   LUNGS: Scattered rhonchi. No wheezes or rales. No dullness.   CARDIAC: Regular rate and rhythm with normal S1 and S2. No significant rubs, murmurs, or gallops. PMI is laterally displaced. Chest wall is not apparently tender.   ABDOMEN: Soft with normoactive bowel sounds. No organomegaly or masses were appreciated. No hernias or bruits were noted.   EXTREMITIES: Without clubbing, cyanosis, or edema. Pulses were trace bilaterally.   SKIN: Cool, but dry. No rash or lesions.   NEUROLOGIC: Unable to be performed due to the patient's unresponsiveness.   PSYCH: Unable to be performed.   LABORATORY, DIAGNOSTIC, AND RADIOLOGICAL DATA: Troponin was 1.20  with a total CK of 388 and an MB of 24. White count was 35.8 with a hemoglobin of 13.3. Glucose 115 with a BUN of 55 and a creatinine of 2.67 with a sodium of 149 and a potassium of 3.4 and a GFR of 18.  Calcium was 15.1. Urinalysis revealed 3+ leukocyte esterase with 1+ bacteria. Chest x-ray showed no acute infiltrate or edema. EKG revealed sinus rhythm with previous anteroseptal myocardial infarction.   ASSESSMENT:  1. Acute myocardial infarction.  2. Acute on chronic renal failure.  3. Hypercalcemia.  4. Urinary tract infection.  5. Hypothermia.  6. Underlying dementia.  7. Ischemic cardiomyopathy.  8. Previous stroke.  9. Hypokalemia.  10. Hyponatremia.   PLAN:  1. The patient's situation was discussed with her son who is a physician in [place], [place]. He plans on coming in the morning after he finishes his shift at the hospital. He requests that the patient be DO NOT RESUSCITATE/NO CODE BLUE. She will be admitted to floor bed with telemetry as a DO NOT RESUSCITATE.  2. She will be kept n.p.o.  3. We will get speech therapy to evaluate her as she probably has aspirated.  4. We will send off blood and urine cultures.  5. Begin IV antibiotics.  6. Begin IV fluids.  7. We will begin empiric Xopenex and Atrovent SVNs and supplement oxygen as needed.  8. We will consult palliative care in the morning.  9. Will obtain an echocardiogram and follow serial cardiac enzymes because of her acute myocardial infarction. We will also consult cardiology in this regard.  10. Her prognosis is extremely poor.  11. Further treatment and evaluation will depend upon the patient's progress.   TOTAL TIME SPENT ON ADMISSION: 50 minutes.   ____________________________ Duane Lope Judithann Sheen, MD jds:ap D: 12/11/2011 00:44:00 ET T: 12/11/2011 09:22:45 ET JOB#: [id number]  cc: Duane Lope. Judithann Sheen, MD, <Dictator> Marya Amsler. Dareen Piano, MD Ariely Riddell Rodena Medin MD ELECTRONICALLY SIGNED 12/11/2011 20:19

## 2015-03-15 NOTE — Consult Note (Signed)
               Electronic Signatures: Lynnea FerrierKlein III, Abagale Boulos J (MD)  (Signed 21-Jan-13 19:21)  Authored: Brief Consult Note   Last Updated: 21-Jan-13 19:21 by Lynnea FerrierKlein III, Jnai Snellgrove J (MD)

## 2015-03-15 NOTE — Consult Note (Signed)
    Comments   Family meeting with pt's 2 sons, and daughter-in-law. Family understands severity of pt's current illness but remain hopeful that she may recover. At baseline, pt had mild dementia (estimated FAST 5), was still ambulatory, but chronically confused. Pt's husband was her primary caregiver. In light, of husband's passing, family understands that patient will not be able to return home and will likely need placement. Will consult CM for assistance. Family in agreement with current medical plan including endocrinology workup for hypercalcemia. Family understansd pt may not recover to baseline and in fact may be at end of life. We discussed the option of the [facility] if pt fails to respond to treatment. Son would be in agreement with this but would want to ensure that pt was not "euthanized with morphine." Family confirms DNR and volunteer that they would not want to pursue a PEG.  questions answered. Family verbalized appreciation for meeting. Will continue to follow.  25 minutes information:(son) - [phone] (c) he may also be reached at the [facility] in Gastroenterology Dpt(daughter-in-law) - [phone](son) - [phone]  Electronic Signatures for Addendum Section:  Phifer, Harriett SineNancy (MD) (Signed Addendum 21-Jan-13 20:11)  Discussed with Elouise MunroeJosh Borders, NP, in detail. Agree with assessment and plan as outlined in above note.   Electronic Signatures: Borders, Daryl EasternJoshua R (NP)  (Signed 21-Jan-13 10:04)  Authored: Palliative Care   Last Updated: 21-Jan-13 20:11 by Phifer, Harriett SineNancy (MD)

## 2015-03-15 NOTE — Discharge Summary (Signed)
PATIENT NAME:  Chelsea Hernandez, Chelsea Hernandez MR#:  [id number] DATE OF BIRTH:  Apr 24, 1919  DATE OF ADMISSION:  12/11/2011 DATE OF DISCHARGE:  12/23/2011  DISCHARGE DIAGNOSES:  1. Encephalopathy.  2. Pneumonia.  3. Hypercalcemia causing the encephalopathy.  4. Hypernatremia contributing to the encephalopathy.  5. History of coronary artery disease, status post myocardial infarction with ischemic cardiomyopathy status post stent placement.  6. Hypothyroid.  7. History of stroke.  8. Senile dementia.  9. History of Bell's palsy.  10. Hypertension.  11. Atrial fibrillation.  12. History of primary hyperparathyroidism, chronic treatment in the past.  13. Myelodysplasia with chronic anemia on Agrylin.  14. Chronic kidney disease stage III.  15. Osteoporosis.  16. Acute on chronic renal failure.   ALLERGIES: Cipro, Boniva, cortisone, Excedrin, E-Mycin, contrast dye, iodine, penicillin, sulfa, tetracycline, Pertrazine.   SOCIAL HISTORY: Widowed recently as per notes noted below. No history of alcohol or tobacco abuse.   FAMILY HISTORY: Coronary artery disease, hypertension, transient ischemic attack, asthma.   DISCHARGE MEDICATIONS:  1. Tylenol 1000 every four hours p.r.n.  2. Zofran 4 mg every six hours p.r.n.  3. Nitroglycerin sublingual p.r.n. chest pain. 4. Heparin 5000 units subcutaneous every 12 hours.  5. Levothyroxine 1,000 mcg daily.  6. Clonidine patch 0.3 weekly. 7. Agrylin 0.5 mg p.o. t.i.d.  8. Lasix 20 mg p.o. daily p.r.n.  9. Potassium chloride 10 mg p.o. daily. 10. Lasix p.r.n.  11. Aspirin 81 mg daily.    HOSPITAL COURSE: The patient was admitted, confused, pretty much obtunded. Her original work-up made us suspicious for this being secondary to hypercalcemia as well as urinary tract infection, Escherichia coli that was pansensitive did grow. Calcium as noted was elevated at 15.1, which was a total calcium and she did have a low albumin as well. Her white blood cell count was  35,800 on admission. She was given Mirim given the lack of a crosser activity with penicillin. Creatinine was high at 2.67. Her sodium was 149. She was volume depleted. She was given normal saline and then half normal saline. However, her sodium did increase further. She was having to give 1/4 normal saline prior to this coming down, it has been normal now for several days even off IV fluids for 24 hours now. She was then treated with calcitonin and finally Zometa for her hypercalcemia which did finally come down. It is 8.2 now which is slightly suppressed, but given the hypoalbuminemia in all likelihood okay. If this stays down with just the calcitonin, she may not need repeat of Zometa which is a little more dangerous with her renal insufficiency though if it increases, as this is followed closely, it may need to be done. Recommend weekly metabolic B panels for now. Will start back on her Agrylin that was held off. Again her white count came down to 14,500. She is off antibiotics now. It is unclear her understanding of the events that took place prior to admission, with her husbands death and her being found obtunded in her house. Care will need to be taken in order for her to understand this process. Her son, Dr. Jen Mowickinson, has been following her closely, is aware of the situation and is helping out immensely.   TIME SPENT: Approximately 35 minutes to do discharge tasks today.    ____________________________ Marya AmslerMarshall W. Dareen PianoAnderson, MD mwa:rbg D: 12/23/2011 07:33:46 ET T: 12/23/2011 08:16:32 ET JOB#: [id number]  cc: Marya AmslerMarshall W. Dareen PianoAnderson, MD, <Dictator> Lauro RegulusMARSHALL W Treyce Spillers MD ELECTRONICALLY SIGNED 12/25/2011 11:22

## 2015-03-15 NOTE — Consult Note (Signed)
Present Illness 81 year old female with known dementia who has had an acute unresponsive episode and still is unresponsive at this time, possibly secondary to rhabdomyolysis and or other types of illness with no current evidence of acute myocardial infarction but does have an elevated troponin of 1.2, consistent with moderate rhabdomyolysis and or renal insufficiency rather than acute myocardial infarction.  The patient was not being cared for due to the husband dying been unable to care for her.  The patient currently is hemodynamically stable  Family history No apparent family members with cardiovascular disease  Social history The patient apparently has not had any alcohol or tobacco use   Physical Exam:   GEN WD    HEENT pink conjunctivae    NECK No masses    RESP crackles    CARD Regular rate and rhythm    ABD soft    LYMPH negative neck    EXTR negative cyanosis/clubbing    SKIN No rashes   Review of Systems:   Subjective/Chief Complaint patient is unresponsive    ROS Pt not able to provide ROS    Medications/Allergies Reviewed Medications/Allergies reviewed     thrombocytosis:    ejeciton fraction of 30%:    cva:    copd:    mi:    htn:    seasonal allergies:    cardiac stents:    Hypercholesterolemia:    chf:        Admit Diagnosis:   RENAL FAILURE: 11-Dec-2011, Active, RENAL FAILURE   AMI ARF UTI HYPOTHERMIA ACUTE OR CHRONIC: 11-Dec-2011, Active, AMI ARF UTI HYPOTHERMIA ACUTE OR CHRONIC      Admit Reason:   Hypothermia: (991.6) Active, ICD9, Effects of hypothermia   UTI (lower urinary tract infection): (599.0) Active, ICD9, Urinary tract infection, site not specified   AMI (acute myocardial infarction): (410.90) Active, ICD9, Acute myocardial infarction, unspecified site, episode of care unspecified   Acute on chronic renal failure: (584.9) Active, ICD9, Acute kidney failure, unspecified      Home Oxygen:   apria: 14-Jul-2004,  Active, [place] at 2lpm  Home Medications:  anagrelide capsule 0.5 mg: 1 cap(s) orally 2 times  a day, Active  Norco 5 mg-325 mg oral tablet: 1-2 tab(s) PO every 4-6 hours as needed FOR PAIN , Active  OxyContin 10 mg oral tablet, extended release:   every 12 hours , Active  Senokot S oral tablet:   2 times a day , Active  bisacodyl 10 mg rectal suppository:   2 times a day as needed  , Active  MiraLax oral powder for reconstitution:   once a day as needed  , Active  Percocet 5/325 oral tablet:   every 4 hours as needed  , Active  tylenol 641m:   every 4 hours as needed  , Active  Aspir 81 oral enteric coated tablet:   once a day , Active  omega-3 polyunsaturated fatty acids oral capsule 12069m   2 times a day , Active  Norvasc 10 mg oral tablet:   once a day lunch, Active  synthroid: 100 mcg  once a day , Active  anagrelide 0.5 mg oral capsule: 1   2 times a day , Active  Toprol-XL 100 mg oral tablet, extended release: 1  orally once a day at lunch, Active  amiodarone tablet 100 mg: 1 tab(s) orally once a day , Active  Lasix tablet 40 mg: 1 tab(s) orally once a day (at bedtime) , Active  multivitamin capsule Multiple  Vitamins: 1 cap(s) orally once a day , Active  phylia: 1  injectable every 6 months, Active  Routine UA:  19-Jan-13 21:30    Color (UA) Yellow   Clarity (UA) Cloudy   Glucose (UA) Negative   Bilirubin (UA) Negative   Ketones (UA) Negative   Specific Gravity (UA) 1.010   Blood (UA) Negative   pH (UA) 6.0   Protein (UA) 30 mg/dL   Nitrite (UA) Negative   Leukocyte Esterase (UA) 3+   RBC (UA) 10 /HPF   WBC (UA) 70 /HPF   Bacteria (UA) 1+   Mucous (UA) PRESENT   Hyaline Cast (UA) 3 /LPF  Routine Hem:  19-Jan-13 22:40    WBC (CBC) 35.8   RBC (CBC) 4.61   Hemoglobin (CBC) 13.3   Hematocrit (CBC) 40.3   Platelet Count (CBC) 676   MCV 88   MCH 28.9   MCHC 33.1   RDW 18.0  Routine Chem:  19-Jan-13 22:40    Glucose, Serum 115   BUN 55   Creatinine (comp) 2.67    Sodium, Serum 149   Potassium, Serum 3.4   Chloride, Serum 110   CO2, Serum 25   Calcium (Total), Serum 15.1  Hepatic:  19-Jan-13 22:40    Bilirubin, Total 0.4   Alkaline Phosphatase 84   SGPT (ALT) 32   SGOT (AST) 49   Total Protein, Serum 7.8   Albumin, Serum 3.5  Routine Chem:  19-Jan-13 22:40    Osmolality (calc) 312   eGFR (African American) 22   eGFR (Non-African American) 18   Anion Gap 14  Cardiac:  19-Jan-13 22:40    CK, Total 388   CPK-MB, Serum 23.9   Troponin I 1.20  Thyroid:  19-Jan-13 22:40    Thyroid Stimulating Hormone 1.29  Cardiac:  20-Jan-13 06:05    CK, Total 257   CPK-MB, Serum 17.7   Troponin I 1.04   EKG:   EKG Interp. by me    Interpretation normal sinus rhythm with left atrial enlargement, inferior and septal infarct, age undetermined    PCN: Unknown  Tetracycline: Unknown  Ephedrine: Unknown  Cortisone Acetate: Unknown  Erythromycin: Unknown  IVP Dye: Unknown  Salicylates: Unknown  Other- Explain in Comments Line: Unknown  Vital Signs/Nurse's Notes: **Vital Signs.:   20-Jan-13 02:40   Vital Signs Type Admission   Temperature Temperature (F) 97.7   Celsius 36.5   Temperature Source axillary   Pulse Pulse 81   Pulse source per Dinamap   Respirations Respirations 18   Systolic BP Systolic BP 088   Diastolic BP (mmHg) Diastolic BP (mmHg) 67   Mean BP 92   BP Source Dinamap   Pulse Ox % Pulse Ox % 98   Pulse Ox Activity Level  At rest   Oxygen Delivery 2L     Impression 81 year old female with dementia and likely grabbed a mild lysis due to no home care due to husband having an acute death with rhabdomyolysis and elevation of troponin, and renal insufficiency without current evidence of acute myocardial infarction    Plan 1.  Continue hydration for rhabdomyolysis and elevation of troponin and CK-MB. 2.  Continue following for responsiveness. 3.  No further cardiac intervention at this time unless patient becomes responsive  and has other hemodynamic abnormalities. 4.  Re addition of medication management for hypertension control.  If patient has responsiveness and starts to improve. 5.  Further treatment options.  After about   Electronic Signatures: Serafina Royals  J (MD)  (Signed 20-Jan-13 08:21)  Authored: General Aspect/Present Illness, History and Physical Exam, Review of System, Past Medical History, Health Issues, Home Medications, Labs, EKG , Allergies, Vital Signs/Nurse's Notes, Impression/Plan   Last Updated: 20-Jan-13 08:21 by Corey Skains (MD)

## 2015-03-15 NOTE — Consult Note (Signed)
PATIENT NAME:  Chelsea Hernandez, Chelsea Hernandez MR#:  [id number] DATE OF BIRTH:  February 24, 1919  DATE OF CONSULTATION:  12/12/2011  REFERRING PHYSICIAN:  Frazier Richards, M.D.  CONSULTING PHYSICIAN:  A. Lavone Orn, MD  REASON FOR CONSULTATION: Hypercalcemia.   HISTORY OF PRESENT ILLNESS: This is a 81 year old female with a history of primary hyperparathyroidism who was admitted yesterday after being found unresponsive at home. Initial work-up revealed acute myocardial infarction, acute renal failure, urinary tract infection and hypercalcemia with an initial calcium level of 15.1. She was initiated on IV fluids and is currently receiving normal saline at 75 mL/h. A repeat calcium this morning was 14.4. Her creatinine has increased from 2.67 to 3.13. Albumin today is low at 2.5 and a corrected calcium for hypoalbuminemia is 15.6. Patient is altered and unable to answer my questions. History was obtained from the records.   Patient is known to me from a single clinic visit in May 2011. She was diagnosed with primary hyperparathyroidism over 10 years ago. At that time, she was felt to be high risk for surgery given her comorbidities and was followed clinically. She has had no apparent complications from her hypercalcemia other than osteoporosis. Over the last year, her calcium has been in the range of 10.0 to 11.4. In the past she had been placed on Sensipar for treatment of hypercalcemia, although this caused somnolence so it was stopped.   PAST MEDICAL HISTORY:  1. Hypercalcemia due to primary hyperparathyroidism.  2. Chronic kidney disease stage III (eGFR 30)  3. Hypothyroidism.  4. Prior cerebrovascular accident.  5. History of Bell's palsy.  6. Dementia.  7. Hypertension.  8. Ischemic cardiomyopathy.  9. Coronary artery disease, status post acute myocardial infarction, 2005.  10. Myelodysplasia.  11. Osteoporosis status post femur fracture 10/2009.   CURRENT INPATIENT MEDICATIONS:  1. Levothyroxine  100 mcg IV daily.  2. Meropenem 500 mg q.12 hours.  3. Protonix 40 mg every 12 hours.  4. Aspirin 300 mg daily.  5. Atrovent 0.5 mg every four hours.  6. Levalbuterol 1.25 mg every four hours.   SOCIAL HISTORY: No tobacco or alcohol.   FAMILY HISTORY: Noncontributory.   REVIEW OF SYSTEMS: Unable to obtain.   PHYSICAL EXAMINATION:  VITAL SIGNS: Temperature 98.7, pulse 97, respiratory rate 20, blood pressure 159/71, pulse oximetry 93% on 4 liters.   GENERAL: Elderly white female minimally alert, looks around but does not follow any commands.   HEENT: No conjunctival injection. No scleral icterus. Oropharynx clear. Mucous membranes moist.   NECK: Supple. No palpable masses.   LYMPH: No submandibular or cervical lymphadenopathy.   CARDIAC: Regular rate and rhythm. No murmur.   PULMONARY: Clear bilaterally.   ABDOMEN: Soft, nondistended.   EXTREMITIES: No edema is present.   SKIN: No ecchymotic lesions or rash is present.   NEUROLOGIC: Unable to assess.   PSYCHIATRIC: Again does not follow commands, lethargic.   LABORATORY, DIAGNOSTIC AND RADIOLOGICAL DATA: Glucose 109, BUN 76, creatinine 3.13, sodium 151, potassium 4.1, chloride 114, CO2 24. eGFR 18, calcium 14.4, albumin 2.5, AST 63, ALT 56, troponin I 1.21, hematocrit 32.6, WBC 30.6, platelets 527. Urine culture positive for Escherichia coli.    ASSESSMENT: 81 year old female with history of primary hyperparathyroidism with moderately elevated calcium over the last year was admitted on 01/20 with severe hypercalcemia likely in part exacerbated by dehydration, also with acute renal failure due to dehydration and evidence of acute myocardial infarction.   RECOMMENDATIONS:  1. For treatment of hypercalcemia, Iagree with use  of ongoing IV fluids. Continue IV fluids, as tolerated until calcium is back to baseline. Bisphosphonates such as Zometa are contraindicated in setting of severe renal insufficiency. Given severe nature of  calcium elevation, will give calcitonin dose that 4 units/kg or 200 units subcutaneous b.i.d. If there is a good response, could increase to q.8 hours dosing. Continue to follow calcium daily.  2.  For hypothyroidism, she takes levothyroxine 100 mcg daily. IV dosing should be half p.o. dosing, so I will decrease the IV levothyroxine to 50 mcg per day.   Thank you for the kind request for consultation. I will be unavailable to see patient tomorrow, however, I will be available again on Wednesday, 01/23. Anticipate that calcitonin will be continued until that time. I will be available by page, if there are any questions.   ____________________________ A. Lavone Orn, MD ams:cms D: 12/12/2011 17:56:08 ET T: 12/13/2011 07:51:54 ET JOB#: [id number]  cc: A. Lavone Orn, MD, <Dictator> Sherlon Handing MD ELECTRONICALLY SIGNED 12/13/2011 13:40
